# Patient Record
Sex: Female | Born: 1990 | Race: Asian | Hispanic: No | Marital: Married | Smoking: Never smoker
Health system: Southern US, Community
[De-identification: ages and names within clinical notes are randomized; demographics above are authoritative.]

## PROBLEM LIST (undated history)

## (undated) DIAGNOSIS — O24419 Gestational diabetes mellitus in pregnancy, unspecified control: Secondary | ICD-10-CM

## (undated) HISTORY — PX: WISDOM TOOTH EXTRACTION: SHX21

## (undated) HISTORY — DX: Gestational diabetes mellitus in pregnancy, unspecified control: O24.419

---

## 2020-03-29 NOTE — L&D Delivery Note (Signed)
Delivery Note At 12:44 PM a viable female was delivered via Vaginal, Spontaneous.  Presentation: vertex; Position: Left,, Occiput,, Anterior  Delivery of the head: 09/12/2020 12:44 PM First maneuver: 09/12/2020 12:44 PM, McRoberts Second maneuver: 09/12/2020 12:44 PM, Suprapubic Pressure Suspect total delay in delivery of anterior shoulder  less than 30 seconds.  APGAR: 8, 9; weight pend.   Placenta status: labor and delivery, .   Cord:  with the following complications: .  Cord pH: pend  Anesthesia:   Episiotomy: None Lacerations:  2nd degree perineal Suture Repair: 2.0 vicryl Est. Blood Loss (mL):  300 cc  Mom to postpartum.  Baby to Couplet care / Skin to Skin.  Lyn Henri 09/12/2020, 1:14 PM

## 2020-05-23 ENCOUNTER — Ambulatory Visit (INDEPENDENT_AMBULATORY_CARE_PROVIDER_SITE_OTHER): Payer: Managed Care, Other (non HMO)

## 2020-05-23 ENCOUNTER — Other Ambulatory Visit: Payer: Self-pay

## 2020-05-23 DIAGNOSIS — Z3200 Encounter for pregnancy test, result unknown: Secondary | ICD-10-CM | POA: Diagnosis not present

## 2020-05-23 DIAGNOSIS — Z3480 Encounter for supervision of other normal pregnancy, unspecified trimester: Secondary | ICD-10-CM

## 2020-05-23 LAB — POCT URINE PREGNANCY: Preg Test, Ur: POSITIVE — AB

## 2020-05-23 MED ORDER — BLOOD PRESSURE KIT DEVI: 1.0000 | 0 refills | Status: DC | PRN

## 2020-05-23 MED ORDER — VITAFOL ULTRA 29-0.6-0.4-200 MG PO CAPS: 1.0000 | ORAL_CAPSULE | Freq: Every day | ORAL | 12 refills | Status: AC

## 2020-05-23 NOTE — Progress Notes (Signed)
Chart reviewed for nurse visit. Agree with plan of care.   Debar Plate M, MD 05/23/20 11:59 AM 

## 2020-05-23 NOTE — Progress Notes (Signed)
..   Ms. Graber presents today for UPT. She has no unusual complaints. LMP:12-15-20    OBJECTIVE: Appears well, in no apparent distress.  OB History    Gravida  1   Para      Term      Preterm      AB      Living        SAB      IAB      Ectopic      Multiple      Live Births             Home UPT Result:Positive  In-Office UPT result:Positive I have reviewed the patient's medical, obstetrical, social, and family histories, and medications.   ASSESSMENT: Positive pregnancy test  PLAN Prenatal care to be completed at: [facility]

## 2020-05-29 LAB — OB RESULTS CONSOLE HIV ANTIBODY (ROUTINE TESTING): HIV: NONREACTIVE

## 2020-05-29 LAB — OB RESULTS CONSOLE HEPATITIS B SURFACE ANTIGEN: Hepatitis B Surface Ag: NEGATIVE

## 2020-05-29 LAB — OB RESULTS CONSOLE RUBELLA ANTIBODY, IGM: Rubella: NON-IMMUNE/NOT IMMUNE

## 2020-05-29 LAB — HEPATITIS C ANTIBODY: HCV Ab: NEGATIVE

## 2020-06-03 ENCOUNTER — Ambulatory Visit: Payer: Managed Care, Other (non HMO)

## 2020-06-03 ENCOUNTER — Other Ambulatory Visit: Payer: Managed Care, Other (non HMO)

## 2020-06-09 ENCOUNTER — Telehealth: Payer: Self-pay | Admitting: Hematology

## 2020-06-09 NOTE — Telephone Encounter (Signed)
Received a new hem referral from Dr. Langston Masker for abnormal blood protein. Virginia Bush has been cld and scheduled to see Dr. Candise Che on 3/24 at 1pm. Pt aware to arrive 20 minutes early.

## 2020-06-16 DIAGNOSIS — Z3490 Encounter for supervision of normal pregnancy, unspecified, unspecified trimester: Secondary | ICD-10-CM | POA: Insufficient documentation

## 2020-06-17 ENCOUNTER — Other Ambulatory Visit: Payer: Managed Care, Other (non HMO)

## 2020-06-17 ENCOUNTER — Encounter: Payer: Managed Care, Other (non HMO) | Admitting: Medical

## 2020-06-17 DIAGNOSIS — Z3490 Encounter for supervision of normal pregnancy, unspecified, unspecified trimester: Secondary | ICD-10-CM

## 2020-06-18 NOTE — Progress Notes (Signed)
HEMATOLOGY/ONCOLOGY CONSULTATION NOTE  Date of Service: 06/18/2020  No care team member to display  CHIEF COMPLAINTS/PURPOSE OF CONSULTATION:  Abnormal hgb electrophoresis  HISTORY OF PRESENTING ILLNESS:   Virginia Bush is a wonderful 30 y.o. female who has been referred to [place] by Linda Hedges, DO for evaluation and management of abnormal plasma protein. The pt reports that she is doing well overall.  The pt reports that she was referred here by her OBGYN due to abnormal Hgb electrophoresis. The pt reports this is her first pregnancy and she is currently [redacted] weeks pregnant. She has been doing well overall. The pt notes her appetite has changed a lot. She notes no concerns outside of gestational diabetes. The pt notes that her husband is [place] American, but he is unsure of his familial sickle cell trait. The pt notes her husband has no symptoms related to this sickle cell anemia.   The pt notes no medical issues or history of medical problems. The pt notes no surgeries in the past. The pt got dizziness related to Penicillin, but denies any other allergies. The pt notes no medicines currently outside of prenatal vitamins. The pt notes no familial history of blood disorders or medical issues. The pt notes her family has some history with diabetes and heart disease. She notes one past family member had a cancer, but is unsure of the type. The pt denies any smoking or alcohol intake. 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  On review of systems, pt denies abdominal pain, nausea, decreased appetite, leg swelling, and any other symptoms.  MEDICAL HISTORY:  No past medical history on file.  SURGICAL HISTORY: No past surgical history on file.  SOCIAL HISTORY: Social History   Socioeconomic History  . Marital status: Single    Spouse name: Not on file  . Number of children: Not on file  . Years of education: Not on file  . Highest education  level: Not on file  Occupational History  . Not on file  Tobacco Use  . Smoking status: Not on file  . Smokeless tobacco: Not on file  Substance and Sexual Activity  . Alcohol use: Not on file  . Drug use: Not on file  . Sexual activity: Not on file  Other Topics Concern  . Not on file  Social History Narrative  . Not on file   Social Determinants of Health   Financial Resource Strain: Not on file  Food Insecurity: Not on file  Transportation Needs: Not on file  Physical Activity: Not on file  Stress: Not on file  Social Connections: Not on file  Intimate Partner Violence: Not on file    FAMILY HISTORY: No family history on file.  ALLERGIES:  is allergic to penicillins.  MEDICATIONS:  Current Outpatient Medications  Medication Sig Dispense Refill  . Blood Pressure Monitoring (BLOOD PRESSURE KIT) DEVI 1 kit by Does not apply route as needed. 1 each 0  . Prenat-Fe Poly-Methfol-FA-DHA (VITAFOL ULTRA) 29-0.6-0.4-200 MG CAPS Take 1 tablet by mouth daily. 30 capsule 12   No current facility-administered medications for this visit.    REVIEW OF SYSTEMS:   10 Point review of Systems was done is negative except as noted above.  PHYSICAL EXAMINATION: ECOG PERFORMANCE STATUS: 0 - Asymptomatic  . Vitals:   06/19/20 1320  BP: 115/85  Pulse: 78  Resp: 18  Temp: 98.5 F (36.9 C)  SpO2: 98%   Filed Weights  06/19/20 1320  Weight: 172 lb 14.4 oz (78.4 kg)   .Body mass index is 27.08 kg/m.  GENERAL:alert, in no acute distress and comfortable SKIN: no acute rashes, no significant lesions EYES: conjunctiva are pink and non-injected, sclera anicteric OROPHARYNX: MMM, no exudates, no oropharyngeal erythema or ulceration NECK: supple, no JVD LYMPH:  no palpable lymphadenopathy in the cervical, axillary or inguinal regions LUNGS: clear to auscultation b/l with normal respiratory effort HEART: regular rate & rhythm ABDOMEN:  normoactive bowel sounds , non tender, not  distended. Extremity: no pedal edema PSYCH: alert & oriented x 3 with fluent speech NEURO: no focal motor/sensory deficits  LABORATORY DATA:  I have reviewed the data as listed   RADIOGRAPHIC STUDIES: I have personally reviewed the radiological images as listed and agreed with the findings in the report. No results found.  ASSESSMENT & PLAN:   30 yo with   1) Elevated Hb F levels -- borderline elevated at 2.6% Likely related to pregnancy. If still elevated 6 months post partum could have some hereditary persistent of fetal hemoglobin. PLAN: -Advised pt that she had some increase in Hgb F. This fetal Hgb abnormal level could be from her pregnant state. -Advised pt that if she does have minorelevated even >6 months post partum there could be some mild hereditary persistence of fetal Hgb, there is no clinical relevance to this. It will just be a finding that is noted. -Advised pt that fetal Hgb can be elevated while pregnant as well. -Advised pt she does not have sickle cell trait and has a sickle cell Hgb of 0.0. -Continue to eat iron rich foods. -Continue prenatal vitamins. -Will hold off on labs today. Will would recommend she have hemoglobin electrophoresis repeated with OBGY or PCP atleast 6 months postpartum. -Will see back as needed.   FOLLOW UP: RTC with Dr Irene Limbo as needed   All of the patients questions were answered with apparent satisfaction. The patient knows to call the clinic with any problems, questions or concerns.  I spent 35 minutes counseling the patient face to face. The total time spent in the appointment was 45 minutes and more than 50% was on counseling and direct patient cares.    Sullivan Lone MD [place] AAHIVMS [facility] Hematology/Oncology Physician [facility]  (Office):       [phone] (Work cell):  [phone] (Fax):           [phone]  06/18/2020 9:08 PM   I, Reinaldo Raddle, am acting as scribe for Dr. Sullivan Lone, MD.  .I have  reviewed the above documentation for accuracy and completeness, and I agree with the above. Brunetta Genera MD

## 2020-06-19 ENCOUNTER — Telehealth: Payer: Self-pay | Admitting: Hematology

## 2020-06-19 ENCOUNTER — Inpatient Hospital Stay: Payer: Managed Care, Other (non HMO) | Attending: Hematology | Admitting: Hematology

## 2020-06-19 ENCOUNTER — Other Ambulatory Visit: Payer: Self-pay

## 2020-06-19 VITALS — BP 115/85 | HR 78 | Temp 98.5°F | Resp 18 | Ht 67.0 in | Wt 172.9 lb

## 2020-06-19 DIAGNOSIS — Z833 Family history of diabetes mellitus: Secondary | ICD-10-CM | POA: Insufficient documentation

## 2020-06-19 DIAGNOSIS — O26892 Other specified pregnancy related conditions, second trimester: Secondary | ICD-10-CM | POA: Insufficient documentation

## 2020-06-19 DIAGNOSIS — Z3A27 27 weeks gestation of pregnancy: Secondary | ICD-10-CM | POA: Diagnosis not present

## 2020-06-19 DIAGNOSIS — D564 Hereditary persistence of fetal hemoglobin [HPFH]: Secondary | ICD-10-CM

## 2020-06-19 DIAGNOSIS — R778 Other specified abnormalities of plasma proteins: Secondary | ICD-10-CM | POA: Diagnosis present

## 2020-06-19 DIAGNOSIS — Z8249 Family history of ischemic heart disease and other diseases of the circulatory system: Secondary | ICD-10-CM | POA: Diagnosis not present

## 2020-06-19 NOTE — Telephone Encounter (Signed)
Checked out appointment. No LOS notes needing to be scheduled. No changes made. 

## 2020-06-25 ENCOUNTER — Encounter: Payer: Managed Care, Other (non HMO) | Attending: Obstetrics & Gynecology | Admitting: Registered"

## 2020-06-25 ENCOUNTER — Encounter: Payer: Self-pay | Admitting: Registered"

## 2020-06-25 ENCOUNTER — Other Ambulatory Visit: Payer: Self-pay

## 2020-06-25 DIAGNOSIS — O24419 Gestational diabetes mellitus in pregnancy, unspecified control: Secondary | ICD-10-CM | POA: Insufficient documentation

## 2020-06-25 NOTE — Progress Notes (Signed)
Patient was seen on 06/25/20 for Gestational Diabetes self-management class at [facility]. The following learning objectives were met by the patient during this course:   States the definition of Gestational Diabetes  States why dietary management is important in controlling blood glucose  Describes the effects each nutrient has on blood glucose levels  Demonstrates ability to create a balanced meal plan  Demonstrates carbohydrate counting   States when to check blood glucose levels  Demonstrates proper blood glucose monitoring techniques  States the effect of stress and exercise on blood glucose levels  States the importance of limiting caffeine and abstaining from alcohol and smoking  Blood glucose monitor given: Accu-chek Guide Me Lot #[id number] Exp: 05/18/2021 CBG: 74 mg/dL  Patient instructed to monitor glucose levels: FBS: 60 - <95; 1 hour: <140; 2 hour: <120  Patient received handouts:  Nutrition Diabetes and Pregnancy, including carb counting list  Patient will be seen for follow-up as needed.

## 2020-08-13 LAB — OB RESULTS CONSOLE GBS: GBS: POSITIVE

## 2020-09-05 ENCOUNTER — Telehealth (HOSPITAL_COMMUNITY): Payer: Self-pay | Admitting: *Deleted

## 2020-09-05 ENCOUNTER — Encounter (HOSPITAL_COMMUNITY): Payer: Self-pay | Admitting: *Deleted

## 2020-09-05 NOTE — Telephone Encounter (Signed)
Preadmission screen  

## 2020-09-10 ENCOUNTER — Other Ambulatory Visit (HOSPITAL_COMMUNITY)
Admission: RE | Admit: 2020-09-10 | Discharge: 2020-09-10 | Disposition: A | Discharge status: Home / Self Care | Payer: Managed Care, Other (non HMO) | Source: Ambulatory Visit | Attending: Obstetrics and Gynecology | Admitting: Obstetrics and Gynecology

## 2020-09-10 DIAGNOSIS — Z20822 Contact with and (suspected) exposure to covid-19: Secondary | ICD-10-CM | POA: Insufficient documentation

## 2020-09-10 DIAGNOSIS — Z01812 Encounter for preprocedural laboratory examination: Secondary | ICD-10-CM | POA: Insufficient documentation

## 2020-09-10 LAB — SARS CORONAVIRUS 2 (TAT 6-24 HRS): SARS Coronavirus 2: NEGATIVE

## 2020-09-10 NOTE — H&P (Addendum)
OB History and Physical Preadmission H&P for scheduled 2 stage IOL  Virginia Bush is a 30 y.o. female G3P0020 presenting for induction of labor at [redacted]w[redacted]d.  Pregnancy history is notable for GBS positive status (with PCN allergy and clindamycin resistance), A1GDM well controlled.  Most recent US at 65 with EFW 72%ile.  She also underwent US at [employer] [facility] office and was told questionable 6 toes seen on [place].  This finding was not consistent with our office scans.   OB History     Gravida  3   Para      Term      Preterm      AB  2   Living  0      SAB      IAB  2   Ectopic      Multiple      Live Births             Past Medical History:  Diagnosis Date   Gestational diabetes    No past surgical history on file. Family History: family history includes Bone cancer in her paternal grandfather; Breast cancer in her maternal grandmother; Cancer in her paternal grandfather and paternal grandmother; Diabetes in her maternal grandfather; Heart disease in her father; Hypertension in her maternal grandfather and maternal grandmother; Lung cancer in her paternal grandfather. Social History:  reports that she has never smoked. She has never used smokeless tobacco. No history on file for alcohol use and drug use.     Maternal Diabetes: Yes:  Diabetes Type:  Diet controlled Genetic Screening: Normal Maternal Ultrasounds/Referrals: Normal Fetal Ultrasounds or other Referrals:  None Maternal Substance Abuse:  No Significant Maternal Medications:  None Significant Maternal Lab Results:  Group B Strep positive Other Comments:  None  Review of Systems denies fever, chills, SOB, CP, HA, lightheadedness, dizziness. + FM.  History   Last menstrual period 12/16/2019. Exam Physical Exam  Gen: alert, well appearing, no distress Chest: nonlabored breathing CV: no peripheral edema Abdomen: contractions present, gravid, nontender Ext: no evidence of DVT  Prenatal labs: ABO, Rh:    Antibody:   Rubella:   RPR:    HBsAg:    HIV:    GBS:     Assessment/Plan: Admit to Labor and Delivery GBS positive, patient with PCN allergy.  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  Cytotec for cervical ripening, followed by pitocin, AROM Epidural when desired Anticipate vaginal delivery.    Lyn Henri 09/10/2020, 3:08 PM

## 2020-09-12 ENCOUNTER — Encounter (HOSPITAL_COMMUNITY): Payer: Self-pay | Admitting: Obstetrics and Gynecology

## 2020-09-12 ENCOUNTER — Inpatient Hospital Stay (HOSPITAL_COMMUNITY): Payer: Managed Care, Other (non HMO) | Admitting: Anesthesiology

## 2020-09-12 ENCOUNTER — Inpatient Hospital Stay (HOSPITAL_COMMUNITY): Payer: Managed Care, Other (non HMO)

## 2020-09-12 ENCOUNTER — Inpatient Hospital Stay (HOSPITAL_COMMUNITY)
Admission: AD | Admit: 2020-09-12 | Discharge: 2020-09-14 | DRG: 807 | Disposition: A | Discharge status: Home / Self Care | Payer: Managed Care, Other (non HMO) | Attending: Obstetrics and Gynecology | Admitting: Obstetrics and Gynecology

## 2020-09-12 ENCOUNTER — Other Ambulatory Visit: Payer: Self-pay

## 2020-09-12 DIAGNOSIS — Z20822 Contact with and (suspected) exposure to covid-19: Secondary | ICD-10-CM | POA: Diagnosis present

## 2020-09-12 DIAGNOSIS — O99824 Streptococcus B carrier state complicating childbirth: Secondary | ICD-10-CM | POA: Diagnosis present

## 2020-09-12 DIAGNOSIS — Z3A40 40 weeks gestation of pregnancy: Secondary | ICD-10-CM

## 2020-09-12 DIAGNOSIS — Z880 Allergy status to penicillin: Secondary | ICD-10-CM

## 2020-09-12 DIAGNOSIS — O24420 Gestational diabetes mellitus in childbirth, diet controlled: Principal | ICD-10-CM | POA: Diagnosis present

## 2020-09-12 DIAGNOSIS — Z3400 Encounter for supervision of normal first pregnancy, unspecified trimester: Secondary | ICD-10-CM

## 2020-09-12 DIAGNOSIS — Z3490 Encounter for supervision of normal pregnancy, unspecified, unspecified trimester: Secondary | ICD-10-CM

## 2020-09-12 DIAGNOSIS — O26893 Other specified pregnancy related conditions, third trimester: Secondary | ICD-10-CM | POA: Diagnosis present

## 2020-09-12 LAB — CBC
HCT: 36.3 % (ref 36.0–46.0)
Hemoglobin: 12.2 g/dL (ref 12.0–15.0)
MCH: 29.8 pg (ref 26.0–34.0)
MCHC: 33.6 g/dL (ref 30.0–36.0)
MCV: 88.8 fL (ref 80.0–100.0)
Platelets: 223 10*3/uL (ref 150–400)
RBC: 4.09 MIL/uL (ref 3.87–5.11)
RDW: 13.7 % (ref 11.5–15.5)
WBC: 9.4 10*3/uL (ref 4.0–10.5)
nRBC: 0 % (ref 0.0–0.2)

## 2020-09-12 LAB — TYPE AND SCREEN
ABO/RH(D): B POS
Antibody Screen: NEGATIVE

## 2020-09-12 LAB — GLUCOSE, CAPILLARY
Glucose-Capillary: 86 mg/dL (ref 70–99)
Glucose-Capillary: 90 mg/dL (ref 70–99)
Glucose-Capillary: 132 mg/dL — ABNORMAL HIGH (ref 70–99)

## 2020-09-12 LAB — RPR: RPR Ser Ql: NONREACTIVE

## 2020-09-12 MED ORDER — DIPHENHYDRAMINE HCL 50 MG/ML IJ SOLN: 12.5000 mg | INTRAMUSCULAR | Status: DC | PRN

## 2020-09-12 MED ORDER — LIDOCAINE HCL (PF) 1 % IJ SOLN: 30.0000 mL | INTRAMUSCULAR | Status: DC | PRN

## 2020-09-12 MED ORDER — FENTANYL-BUPIVACAINE-NACL 0.5-0.125-0.9 MG/250ML-% EP SOLN
EPIDURAL | Status: DC | PRN
  Administered 2020-09-12: 12 mL/h via EPIDURAL

## 2020-09-12 MED ORDER — VANCOMYCIN HCL IN DEXTROSE 1-5 GM/200ML-% IV SOLN
1000.0000 mg | Freq: Two times a day (BID) | INTRAVENOUS | Status: DC
  Administered 2020-09-12: 1000 mg via INTRAVENOUS
  Filled 2020-09-12: qty 200

## 2020-09-12 MED ORDER — ZOLPIDEM TARTRATE 5 MG PO TABS: 5.0000 mg | ORAL_TABLET | Freq: Every evening | ORAL | Status: DC | PRN

## 2020-09-12 MED ORDER — OXYCODONE-ACETAMINOPHEN 5-325 MG PO TABS
2.0000 | ORAL_TABLET | ORAL | Status: DC | PRN
Start: 2020-09-12 — End: 2020-09-12

## 2020-09-12 MED ORDER — PHENYLEPHRINE 40 MCG/ML (10ML) SYRINGE FOR IV PUSH (FOR BLOOD PRESSURE SUPPORT): 80.0000 ug | PREFILLED_SYRINGE | INTRAVENOUS | Status: DC | PRN

## 2020-09-12 MED ORDER — IBUPROFEN 600 MG PO TABS
600.0000 mg | ORAL_TABLET | Freq: Four times a day (QID) | ORAL | Status: DC
  Administered 2020-09-12 – 2020-09-14 (×8): 600 mg via ORAL
  Filled 2020-09-12 (×8): qty 1

## 2020-09-12 MED ORDER — SOD CITRATE-CITRIC ACID 500-334 MG/5ML PO SOLN: 30.0000 mL | ORAL | Status: DC | PRN

## 2020-09-12 MED ORDER — FENTANYL-BUPIVACAINE-NACL 0.5-0.125-0.9 MG/250ML-% EP SOLN
12.0000 mL/h | EPIDURAL | Status: DC | PRN
  Filled 2020-09-12: qty 250

## 2020-09-12 MED ORDER — HYDROXYZINE HCL 50 MG PO TABS: 50.0000 mg | ORAL_TABLET | Freq: Four times a day (QID) | ORAL | Status: DC | PRN

## 2020-09-12 MED ORDER — ONDANSETRON HCL 4 MG/2ML IJ SOLN: 4.0000 mg | Freq: Four times a day (QID) | INTRAMUSCULAR | Status: DC | PRN

## 2020-09-12 MED ORDER — PHENYLEPHRINE 40 MCG/ML (10ML) SYRINGE FOR IV PUSH (FOR BLOOD PRESSURE SUPPORT)
80.0000 ug | PREFILLED_SYRINGE | INTRAVENOUS | Status: DC | PRN
  Filled 2020-09-12: qty 10

## 2020-09-12 MED ORDER — ACETAMINOPHEN 325 MG PO TABS: 650.0000 mg | ORAL_TABLET | ORAL | Status: DC | PRN

## 2020-09-12 MED ORDER — SENNOSIDES-DOCUSATE SODIUM 8.6-50 MG PO TABS
2.0000 | ORAL_TABLET | ORAL | Status: DC
  Administered 2020-09-12 – 2020-09-13 (×2): 2 via ORAL
  Filled 2020-09-12 (×2): qty 2

## 2020-09-12 MED ORDER — OXYTOCIN-SODIUM CHLORIDE 30-0.9 UT/500ML-% IV SOLN
1.0000 m[IU]/min | INTRAVENOUS | Status: DC
Start: 2020-09-12 — End: 2020-09-12
  Administered 2020-09-12: 2 m[IU]/min via INTRAVENOUS

## 2020-09-12 MED ORDER — ONDANSETRON HCL 4 MG PO TABS: 4.0000 mg | ORAL_TABLET | ORAL | Status: DC | PRN

## 2020-09-12 MED ORDER — TERBUTALINE SULFATE 1 MG/ML IJ SOLN: 0.2500 mg | Freq: Once | INTRAMUSCULAR | Status: DC | PRN

## 2020-09-12 MED ORDER — SIMETHICONE 80 MG PO CHEW
80.0000 mg | CHEWABLE_TABLET | ORAL | Status: DC | PRN
Start: 2020-09-12 — End: 2020-09-14

## 2020-09-12 MED ORDER — LIDOCAINE HCL (PF) 1 % IJ SOLN
INTRAMUSCULAR | Status: DC | PRN
  Administered 2020-09-12: 5 mL via EPIDURAL

## 2020-09-12 MED ORDER — MISOPROSTOL 25 MCG QUARTER TABLET
25.0000 ug | ORAL_TABLET | ORAL | Status: DC | PRN
  Administered 2020-09-12: 25 ug via VAGINAL
  Filled 2020-09-12 (×2): qty 1

## 2020-09-12 MED ORDER — WITCH HAZEL-GLYCERIN EX PADS: 1.0000 "application " | MEDICATED_PAD | CUTANEOUS | Status: DC | PRN

## 2020-09-12 MED ORDER — BENZOCAINE-MENTHOL 20-0.5 % EX AERO
1.0000 "application " | INHALATION_SPRAY | CUTANEOUS | Status: DC | PRN
  Administered 2020-09-12: 1 via TOPICAL
  Filled 2020-09-12: qty 56

## 2020-09-12 MED ORDER — EPHEDRINE 5 MG/ML INJ: 10.0000 mg | INTRAVENOUS | Status: DC | PRN

## 2020-09-12 MED ORDER — LACTATED RINGERS IV SOLN: INTRAVENOUS | Status: DC

## 2020-09-12 MED ORDER — DIBUCAINE (PERIANAL) 1 % EX OINT
1.0000 "application " | TOPICAL_OINTMENT | CUTANEOUS | Status: DC | PRN
  Administered 2020-09-12: 1 via RECTAL
  Filled 2020-09-12: qty 28

## 2020-09-12 MED ORDER — OXYTOCIN BOLUS FROM INFUSION
333.0000 mL | Freq: Once | INTRAVENOUS | Status: AC
  Administered 2020-09-12: 333 mL via INTRAVENOUS

## 2020-09-12 MED ORDER — LACTATED RINGERS IV SOLN
500.0000 mL | Freq: Once | INTRAVENOUS | Status: AC
  Administered 2020-09-12: 500 mL via INTRAVENOUS

## 2020-09-12 MED ORDER — LACTATED RINGERS IV SOLN
500.0000 mL | INTRAVENOUS | Status: DC | PRN
  Administered 2020-09-12: 500 mL via INTRAVENOUS

## 2020-09-12 MED ORDER — COCONUT OIL OIL
1.0000 "application " | TOPICAL_OIL | Status: DC | PRN
  Administered 2020-09-14: 1 via TOPICAL

## 2020-09-12 MED ORDER — BUTORPHANOL TARTRATE 1 MG/ML IJ SOLN
1.0000 mg | INTRAMUSCULAR | Status: DC | PRN
  Administered 2020-09-12 (×2): 1 mg via INTRAVENOUS
  Filled 2020-09-12 (×2): qty 1

## 2020-09-12 MED ORDER — ONDANSETRON HCL 4 MG/2ML IJ SOLN: 4.0000 mg | INTRAMUSCULAR | Status: DC | PRN

## 2020-09-12 MED ORDER — TETANUS-DIPHTH-ACELL PERTUSSIS 5-2.5-18.5 LF-MCG/0.5 IM SUSY: 0.5000 mL | PREFILLED_SYRINGE | Freq: Once | INTRAMUSCULAR | Status: DC

## 2020-09-12 MED ORDER — PRENATAL MULTIVITAMIN CH
1.0000 | ORAL_TABLET | Freq: Every day | ORAL | Status: DC
  Administered 2020-09-13 – 2020-09-14 (×2): 1 via ORAL
  Filled 2020-09-12 (×2): qty 1

## 2020-09-12 MED ORDER — OXYCODONE-ACETAMINOPHEN 5-325 MG PO TABS: 1.0000 | ORAL_TABLET | ORAL | Status: DC | PRN

## 2020-09-12 MED ORDER — OXYTOCIN-SODIUM CHLORIDE 30-0.9 UT/500ML-% IV SOLN
2.5000 [IU]/h | INTRAVENOUS | Status: DC
  Filled 2020-09-12: qty 500

## 2020-09-12 MED ORDER — DIPHENHYDRAMINE HCL 25 MG PO CAPS: 25.0000 mg | ORAL_CAPSULE | Freq: Four times a day (QID) | ORAL | Status: DC | PRN

## 2020-09-12 NOTE — Anesthesia Procedure Notes (Signed)
Epidural Patient location during procedure: OB Start time: 09/12/2020 8:23 AM End time: 09/12/2020 8:38 AM  Staffing Anesthesiologist: Lucretia Kern, MD Performed: anesthesiologist   Preanesthetic Checklist Completed: patient identified, IV checked, risks and benefits discussed, monitors and equipment checked, pre-op evaluation and timeout performed  Epidural Patient position: sitting Prep: DuraPrep Patient monitoring: heart rate, continuous pulse ox and blood pressure Approach: midline Location: L3-L4 Injection technique: LOR air  Needle:  Needle type: Tuohy  Needle gauge: 17 G Needle length: 9 cm Needle insertion depth: 5 cm Catheter type: closed end flexible Catheter size: 19 Gauge Catheter at skin depth: 10 cm Test dose: negative  Assessment Events: blood not aspirated, injection not painful, no injection resistance, no paresthesia and negative IV test  Additional Notes Reason for block:procedure for pain

## 2020-09-12 NOTE — Anesthesia Preprocedure Evaluation (Signed)
Anesthesia Evaluation  Patient identified by MRN, date of birth, ID band Patient awake    Reviewed: Allergy & Precautions, H&P , NPO status , Patient's Chart, lab work & pertinent test results  History of Anesthesia Complications Negative for: history of anesthetic complications  Airway Mallampati: II  TM Distance: >3 FB     Dental   Pulmonary neg pulmonary ROS,    Pulmonary exam normal        Cardiovascular negative cardio ROS   Rhythm:regular Rate:Normal     Neuro/Psych negative neurological ROS  negative psych ROS   GI/Hepatic negative GI ROS, Neg liver ROS,   Endo/Other  diabetes, Gestational  Renal/GU negative Renal ROS  negative genitourinary   Musculoskeletal   Abdominal   Peds  Hematology negative hematology ROS (+)   Anesthesia Other Findings   Reproductive/Obstetrics (+) Pregnancy                             Anesthesia Physical Anesthesia Plan  ASA: 2  Anesthesia Plan: Epidural   Post-op Pain Management:    Induction:   PONV Risk Score and Plan:   Airway Management Planned:   Additional Equipment:   Intra-op Plan:   Post-operative Plan:   Informed Consent: I have reviewed the patients History and Physical, chart, labs and discussed the procedure including the risks, benefits and alternatives for the proposed anesthesia with the patient or authorized representative who has indicated his/her understanding and acceptance.       Plan Discussed with:   Anesthesia Plan Comments:         Anesthesia Quick Evaluation  

## 2020-09-12 NOTE — Lactation Note (Signed)
This note was copied from a baby's chart. Lactation Consultation Note  Patient Name: Virginia Bush [id number] Date: 09/12/2020 Reason for consult: L&D Initial assessment Age:31 hours  L&D RN called me to let me know that infant was crawling to the breast. I went to L&D room to assist with latch. I assisted "Zach" w/latching. Mom was comfortable with latch. Parents were pleased.  Dad was shown how to help with infant's positioning and how to create an air space for nose, as Mom's arm movements were limited due to IV, BP cuff, etc.   Mom is a P1 and reports + breast changes w/pregnancy.   Lurline Hare [facility] 09/12/2020, 1:55 PM

## 2020-09-12 NOTE — Progress Notes (Signed)
Labor Progress Note  Patient doing well, responded well to cytotec.  No comfortable with epidural and transitioning to pitocin.  AROM performed in usually fashion, head was well applied. Fluid clear.  Cervix after AROM 6/80/-2.  FHT cat 1, accel noted with AROM.  Nilda Simmer MD

## 2020-09-13 LAB — CBC
HCT: 30.1 % — ABNORMAL LOW (ref 36.0–46.0)
Hemoglobin: 9.9 g/dL — ABNORMAL LOW (ref 12.0–15.0)
MCH: 30.1 pg (ref 26.0–34.0)
MCHC: 32.9 g/dL (ref 30.0–36.0)
MCV: 91.5 fL (ref 80.0–100.0)
Platelets: 178 10*3/uL (ref 150–400)
RBC: 3.29 MIL/uL — ABNORMAL LOW (ref 3.87–5.11)
RDW: 13.9 % (ref 11.5–15.5)
WBC: 11.4 10*3/uL — ABNORMAL HIGH (ref 4.0–10.5)
nRBC: 0 % (ref 0.0–0.2)

## 2020-09-13 MED ORDER — SALINE SPRAY 0.65 % NA SOLN
1.0000 | NASAL | Status: DC | PRN
  Filled 2020-09-13: qty 44

## 2020-09-13 MED ORDER — ACETAMINOPHEN 325 MG PO TABS: 650.0000 mg | ORAL_TABLET | ORAL | 0 refills | Status: AC | PRN

## 2020-09-13 MED ORDER — IRON (FERROUS SULFATE) 325 (65 FE) MG PO TABS: 1.0000 | ORAL_TABLET | ORAL | 0 refills | Status: AC

## 2020-09-13 MED ORDER — IBUPROFEN 600 MG PO TABS: 600.0000 mg | ORAL_TABLET | Freq: Four times a day (QID) | ORAL | 0 refills | Status: AC

## 2020-09-13 MED ORDER — DOCUSATE SODIUM 100 MG PO CAPS: 100.0000 mg | ORAL_CAPSULE | Freq: Two times a day (BID) | ORAL | 0 refills | Status: AC

## 2020-09-13 NOTE — Progress Notes (Signed)
Postpartum Progress Note  Post Partum Day 1 s/p spontaneous vaginal delivery.  Patient reports well-controlled pain, ambulating without difficulty, voiding spontaneously, tolerating PO.  Vaginal bleeding is appropriate. She reports some residual numbness in her R leg.   Objective: Blood pressure 118/72, pulse 66, temperature 98.6 F (37 C), temperature source Oral, resp. rate 18, weight 87.9 kg, last menstrual period 12/16/2019, SpO2 100 %, unknown if currently breastfeeding.  Physical Exam:  General: alert and no distress Lochia: appropriate Uterine Fundus: firm DVT Evaluation: No evidence of DVT seen on physical exam.  Recent Labs    09/12/20 0052 09/13/20 0504  HGB 12.2 9.9*  HCT 36.3 30.1*    Assessment/Plan: Postpartum Day 1, s/p vaginal delivery. Continue routine postpartum care Lactation following Baby boy, will perform circ prior to discharge Anticipate discharge home today or tomorrow   LOS: 1 day   Lyn Henri 09/13/2020, 7:50 AM

## 2020-09-13 NOTE — Anesthesia Postprocedure Evaluation (Signed)
Anesthesia Post Note  Patient: Virginia Bush  Procedure(s) Performed: AN AD HOC LABOR EPIDURAL     Patient location during evaluation: Mother Baby Anesthesia Type: Epidural Level of consciousness: awake and alert Pain management: pain level controlled Vital Signs Assessment: post-procedure vital signs reviewed and stable Respiratory status: spontaneous breathing, nonlabored ventilation and respiratory function stable Cardiovascular status: stable Postop Assessment: no headache, no backache and epidural receding Anesthetic complications: no   No notable events documented.  Last Vitals:  Vitals:   09/13/20 0005 09/13/20 0545  BP: 120/89 118/72  Pulse: 66 66  Resp: 18 18  Temp: 36.6 C 37 C  SpO2: 100% 100%    Last Pain:  Vitals:   09/13/20 0921  TempSrc:   PainSc: 0-No pain   Pain Goal:                   Rica Records

## 2020-09-13 NOTE — Lactation Note (Addendum)
This note was copied from a baby's chart. Lactation Consultation Note  Patient Name: Virginia Bush Date: 09/13/2020 Reason for consult: Initial assessment Age:31 Hours   Mother is a P46, Mother reports that infant has been sleeping since circumcision. She has attempt to breastfeed once , but infant too sleepy.  Mother was given [facility] brochure and basic teaching done. Parents has lots of questions that were answered.  Reviewed hand expression with mother. Observed large drops of colostrum. Observed that mother has dimpled nipples. The Rt nipple is semi- inverted. Nipple retracts when compressed.  Infant placed in cross cradle hold. Infant latched, assist with flanging lips for wide gape. Parents observed infant with swallowing pattern of suckling and audible swallows. Infant sustained latch for 20 mins. Infant pinched nipple with only to section of nipple protruding. Infant switched to football hold. Infant obtained a much deeper latch. Observed for 15 mins. Mother nipple round when infant released the breast.  Discussed feeding infant with early or mid feeding cues.  Parents receptive to all teaching.  Staff nurse brought in a Harmony hand pump and will advise in use.  Instruct mother to hand express and spoon feed if infant doesn't have a good feeding. She will page staff member to assist if she decides to spoon feed infant.  Discussed cue base feeding. Parents were discussing going home this pm. After discussing more teaching about cluster feeding parents will likely stay until am. Mother to continue to cue base feed infant and feed at least 8-12 times or more in 24 hours and advised to allow for cluster feeding infant as needed.   Mother to continue to due STS. Mother is aware of available LC services at [facility], BFSG'S, OP Dept, and phone # for questions or concerns about breastfeeding.  Mother receptive to all teaching and plan of care.    Maternal Data Has patient been taught Hand  Expression?: Yes Does the patient have breastfeeding experience prior to this delivery?: No  Feeding Mother's Current Feeding Choice: Breast Milk  LATCH Score Latch: Grasps breast easily, tongue down, lips flanged, rhythmical sucking.  Audible Swallowing: Spontaneous and intermittent  Type of Nipple: Everted at rest and after stimulation (nipple round when infant released the breast)  Comfort (Breast/Nipple): Soft / non-tender  Hold (Positioning): Assistance needed to correctly position infant at breast and maintain latch.  LATCH Score: 9   Lactation Tools Discussed/Used    Interventions    Discharge Discharge Education: Engorgement and breast care;Warning signs for feeding baby;Outpatient recommendation (Parents wanted to be D/C this pm. undecided at this time.) Pump: Personal (Spectra at home)  Consult Status Consult Status: Follow-up Date: 09/13/20 Follow-up type: In-patient    Virginia Bush [facility] 09/13/2020, 4:46 PM

## 2020-09-14 NOTE — Lactation Note (Addendum)
This note was copied from a baby's chart. Lactation Consultation Note  Patient Name: Virginia Bush [id number] Date: 09/14/2020 Reason for consult: Follow-up assessment Age:31 Hours   Mother paged for [facility] assistance. Mother had questions about hand expression and latching infant.  Mother reports that her milk has gone away and her nipples are now sore.  Assist  mother with hand expression technique and observed flow of colostrum.  Mothers nipples are slightly pink in the center. Mother was given comfort gels and coconut oil with instructions to use as needed.   Assist with placing infant on the breast in football hold. Infant sustained latch for 10 min.  Mother reports that she has a pain scale of a #4-5. Infant unlatched from the breast. Assessed infants lip and tongue. Observed that infant has upper lip restrictions and observed a tight anterior frenulum.   Encouraged mother to follow up with OP LC. Send a message through Epic for mother to get a call for follow.  Mother was also given a handout with providers names for follow up to evaluate infants tongue mobility.  Discussed pre-pumping before latching infant . Discussed treatment and prevention of engorgement.  Reviewed all LC resources for follow up with Mother . She has very little support in area at this time. Parents receptive to all teaching and call LC services as needed.   Maternal Data    Feeding Mother's Current Feeding Choice: Breast Milk  LATCH Score Latch: Grasps breast easily, tongue down, lips flanged, rhythmical sucking.  Audible Swallowing: A few with stimulation  Type of Nipple: Everted at rest and after stimulation  Comfort (Breast/Nipple): Filling, red/small blisters or bruises, mild/mod discomfort  Hold (Positioning): Assistance needed to correctly position infant at breast and maintain latch.  LATCH Score: 7   Lactation Tools Discussed/Used    Interventions Interventions: Assisted with  latch;Pre-pump if needed;Adjust position;Support pillows;Position options;Breast compression;Comfort gels;Education  Discharge Discharge Education: Engorgement and breast care;Warning signs for feeding baby;Outpatient recommendation;Outpatient Epic message sent Pump: Manual;Personal  Consult Status Consult Status: Complete    Michel Bickers 09/14/2020, 12:32 PM

## 2020-09-14 NOTE — Discharge Summary (Signed)
Obstetric Discharge Summary  Virginia Bush is a 30 y.o. female that presented on 09/12/2020 for IOL at 40w. Pregnancy course notable for A1GDM, well controlled.  She was admitted to labor and delivery for her induction.  Her labor course was uncomplicated and she delivered a viable female infant on 09/12/20.  Her postpartum course was uncomplicated and on PPD#2, she reported well controlled pain, spontaneous voiding, ambulating without difficulty, and tolerating PO.  She was stable for discharge home on 09/14/20 with plans for in-office follow up.  Hemoglobin  Date Value Ref Range Status  09/13/2020 9.9 (L) 12.0 - 15.0 g/dL Final   HCT  Date Value Ref Range Status  09/13/2020 30.1 (L) 36.0 - 46.0 % Final    Physical Exam:  General: alert and no distress Lochia: appropriate Uterine Fundus: firm DVT Evaluation: No evidence of DVT seen on physical exam.  Discharge Diagnoses: Term Pregnancy-delivered  Discharge Information: Date: 09/14/2020 Activity: Pelvic rest, as tolerated Diet: routine Medications: Tylenol, motrin, iron and colace. Condition: stable Instructions: Refer to practice specific booklet.  Discussed prior to discharge.  Discharge to: Home  Follow-up Information     [place] Follow up.   Why: Please follow up for 6 week postpartum visit and glucose tolerance test. Contact information: [address] [place] [address] [phone]                 Newborn Data: Live born female  Birth Weight: 8 lb 11.7 oz (3960 g) APGAR: 8, 9  Newborn Delivery   Time head delivered: 09/12/2020 12:44:00 Birth date/time: 09/12/2020 12:44:00 Delivery type: Vaginal, Spontaneous      Home with mother.  Lyn Henri 09/14/2020, 1:11 PM

## 2020-09-14 NOTE — Progress Notes (Signed)
Postpartum Progress Note  Post Partum Day 1 s/p spontaneous vaginal delivery. No issues overnight, patient decided to stay to work on baby's feeding schedule. Patient reports well-controlled pain, ambulating without difficulty, voiding spontaneously, tolerating PO.  Vaginal bleeding is appropriate.   Objective: Blood pressure 129/82, pulse 67, temperature 98.4 F (36.9 C), temperature source Oral, resp. rate 18, weight 87.9 kg, last menstrual period 12/16/2019, SpO2 100 %, unknown if currently breastfeeding.  Physical Exam:  General: alert and no distress, up, moving throughout room Lochia: appropriate Uterine Fundus: firm DVT Evaluation: No evidence of DVT seen on physical exam.  Recent Labs    09/12/20 0052 09/13/20 0504  HGB 12.2 9.9*  HCT 36.3 30.1*    Assessment/Plan: Postpartum Day 2, s/p vaginal delivery. Continue routine postpartum care Lactation following Baby Zack s/p circumcision Anticipate discharge home today   LOS: 2 days   Lyn Henri 09/14/2020, 7:48 AM

## 2020-09-19 ENCOUNTER — Other Ambulatory Visit: Payer: Self-pay | Admitting: Obstetrics and Gynecology

## 2020-09-19 DIAGNOSIS — R2232 Localized swelling, mass and lump, left upper limb: Secondary | ICD-10-CM

## 2020-09-21 ENCOUNTER — Inpatient Hospital Stay (HOSPITAL_COMMUNITY): Admit: 2020-09-21 | Payer: Self-pay

## 2020-09-23 ENCOUNTER — Other Ambulatory Visit: Payer: Self-pay

## 2020-09-23 ENCOUNTER — Other Ambulatory Visit: Payer: Self-pay | Admitting: Obstetrics and Gynecology

## 2020-09-23 ENCOUNTER — Ambulatory Visit
Admission: RE | Admit: 2020-09-23 | Discharge: 2020-09-23 | Disposition: A | Discharge status: Home / Self Care | Payer: Managed Care, Other (non HMO) | Source: Ambulatory Visit | Attending: Obstetrics and Gynecology | Admitting: Obstetrics and Gynecology

## 2020-09-23 DIAGNOSIS — R2232 Localized swelling, mass and lump, left upper limb: Secondary | ICD-10-CM

## 2020-10-22 ENCOUNTER — Inpatient Hospital Stay: Admission: RE | Admit: 2020-10-22 | Payer: Managed Care, Other (non HMO) | Source: Ambulatory Visit

## 2021-03-25 ENCOUNTER — Other Ambulatory Visit: Payer: Managed Care, Other (non HMO)

## 2022-02-02 IMAGING — MG DIGITAL DIAGNOSTIC BILAT W/ TOMO W/ CAD
6 of 10 series · 6 of 30 positions shown · non-contrast
Comparison: None.

CLINICAL DATA: Recently pregnant/delivered. Now with palpable lump
in the LEFT axilla. This is patient's first mammogram.

EXAM:
DIGITAL DIAGNOSTIC BILATERAL MAMMOGRAM WITH TOMOSYNTHESIS AND CAD;
US AXILLARY LEFT
TECHNIQUE: Bilateral digital diagnostic mammography and breast tomosynthesis
was performed. The images were evaluated with computer-aided
detection.; Targeted ultrasound examination of the left axilla was
performed.

[L CC synth-2D]
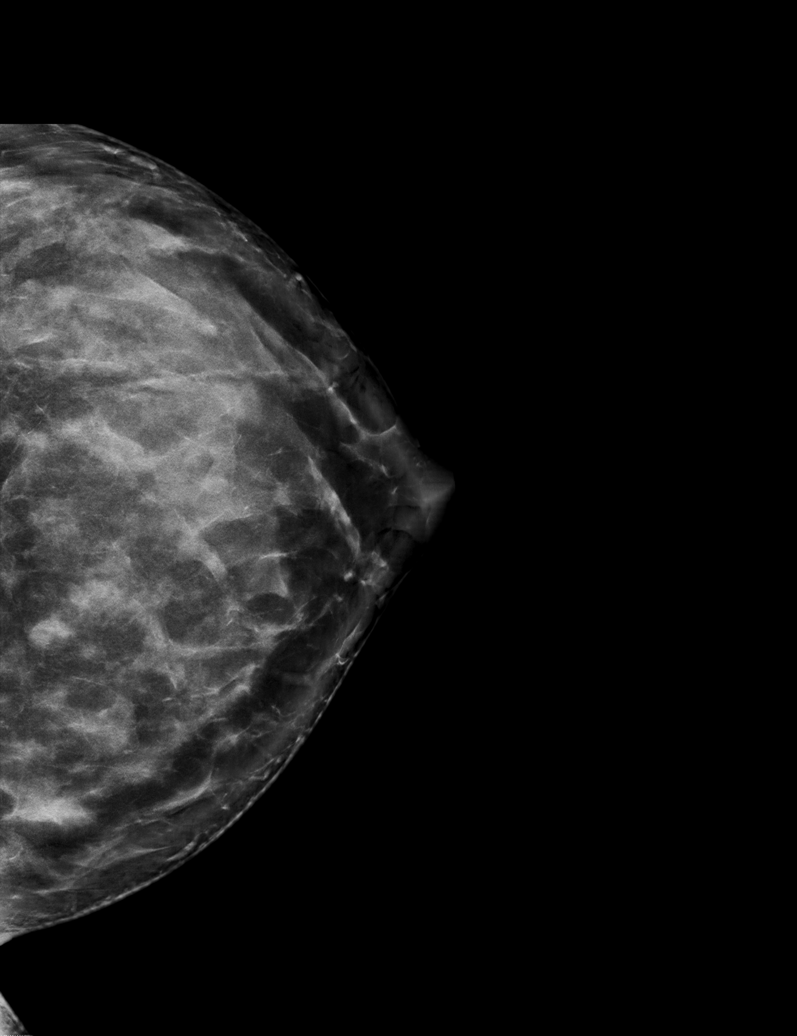

[R CC synth-2D]
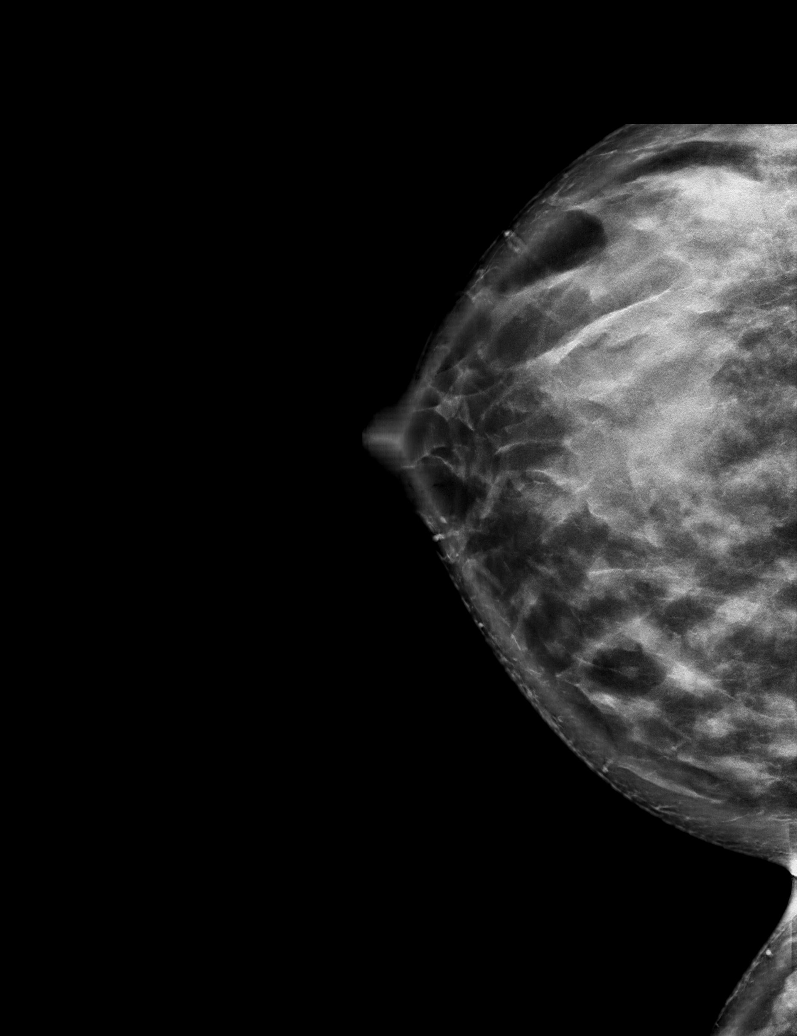

[L MLO synth-2D (1 of 2)]
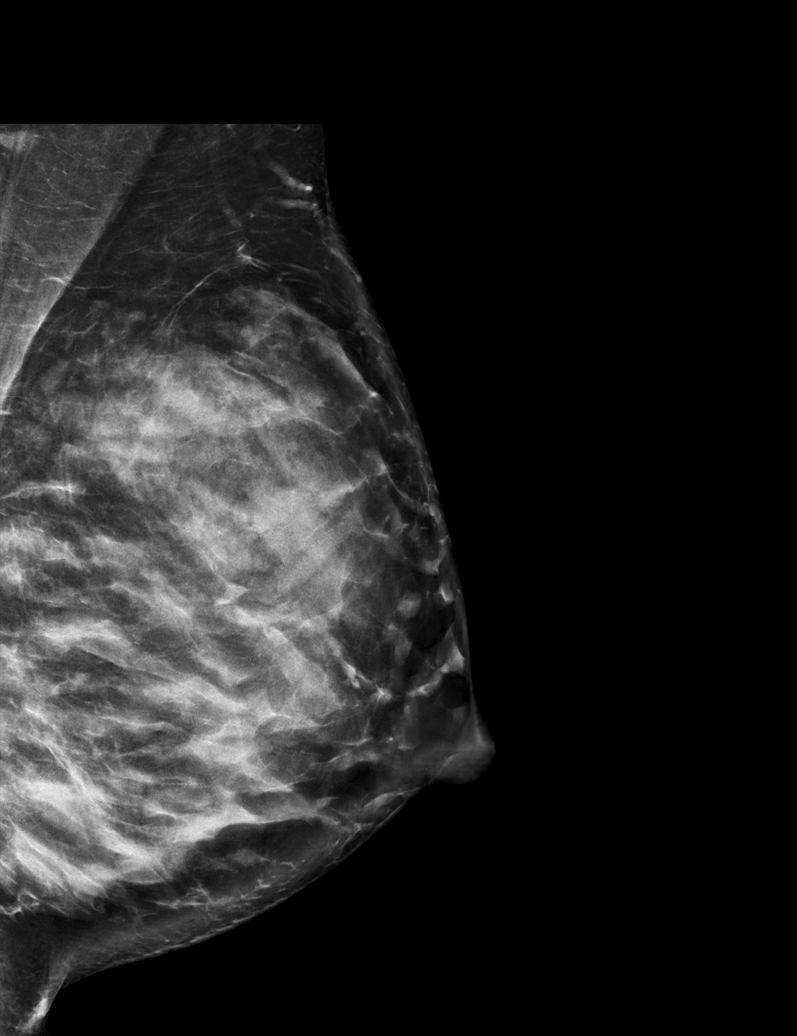

[R MLO synth-2D]
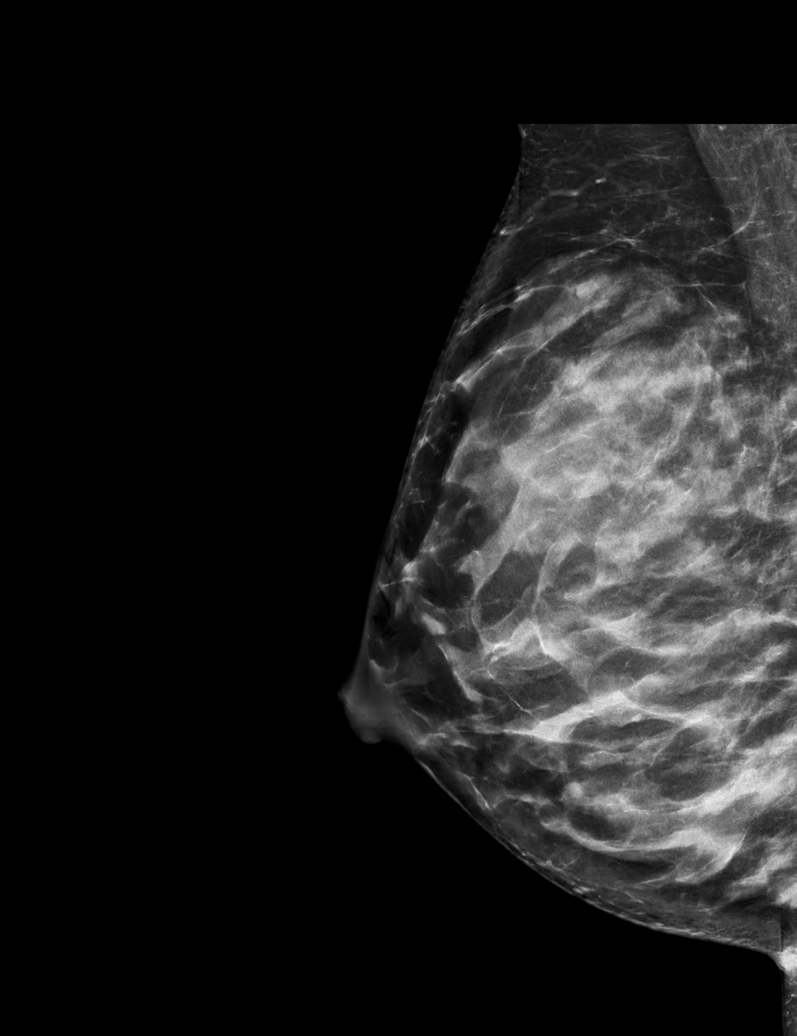

[L MLO synth-2D (2 of 2)]
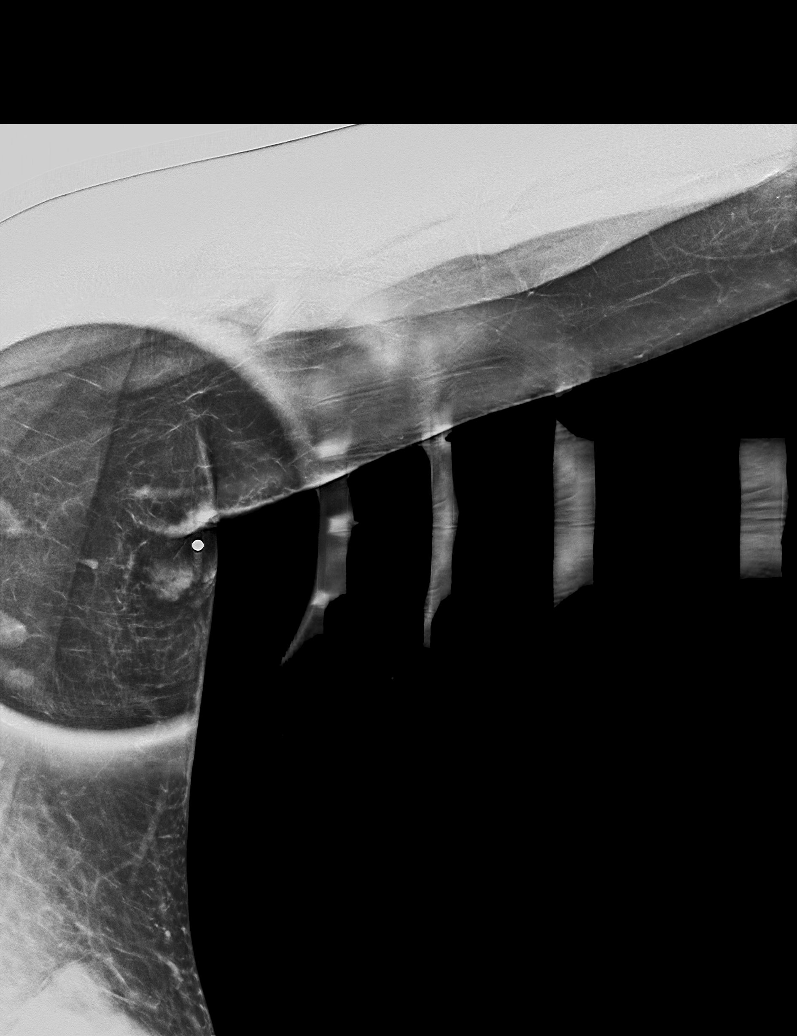

[L CC tomo · tomo slice 48/95.0]
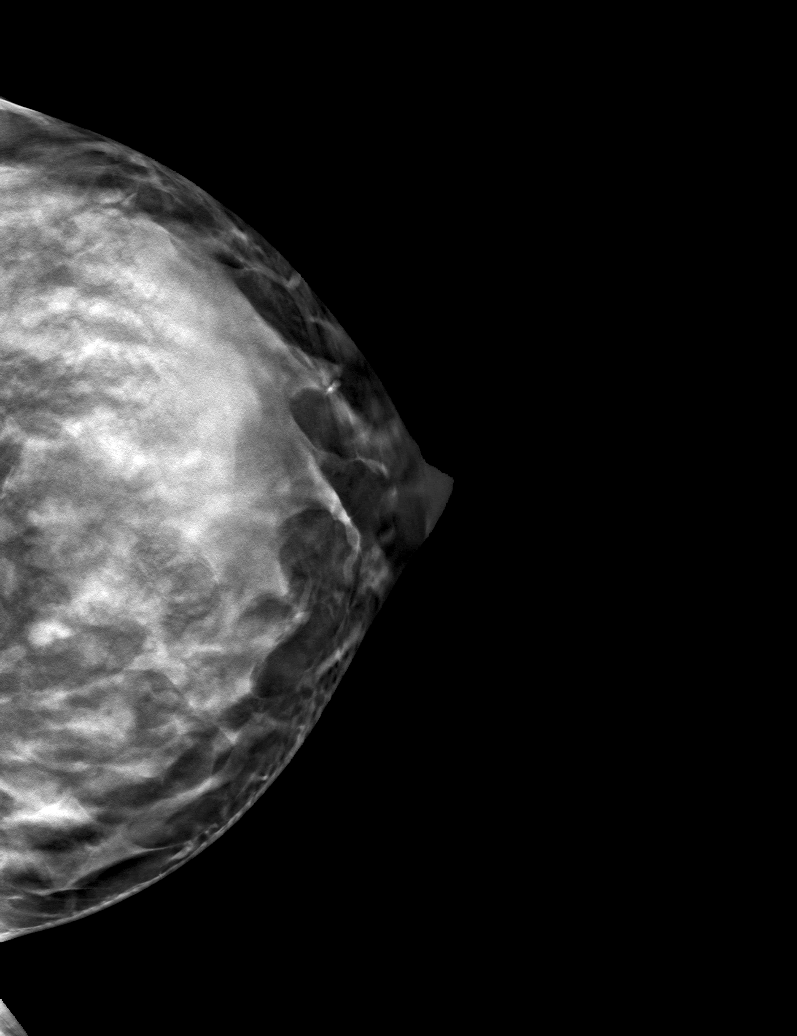

[6 of 30 positions shown; findings below may reference images not displayed]

ACR Breast Density Category d: The breast tissue is extremely dense,
which lowers the sensitivity of mammography.
FINDINGS: Bilateral diagnostic mammogram: There are no dominant masses,
suspicious calcifications or secondary signs of malignancy
identified within either breast.

On physical exam, there is visible palpable lump within the LEFT
axilla. No skin redness. No associated pain, per the patient.

Targeted ultrasound is performed, evaluating the LEFT axilla as
directed by the patient, showing a cystic-appearing mass within the
superficial soft tissues of the LEFT axilla, with serpiginous
configuration, without internal vascularity, overall measuring
cm greatest dimension, the dominant cystic component measuring 7 x 4
x 6 mm, most suggestive of a benign epidermal inclusion cyst or
galactocele.
IMPRESSION: Probably benign complicated inclusion cyst versus galactocele within
the LEFT axilla. Recommend follow-up ultrasound in 1 month to ensure
stability or resolution.

RECOMMENDATION:
1. LEFT axillary ultrasound in 1 month.
2. Patient was instructed to return sooner if any skin redness or
pain developed.

I have discussed the findings and recommendations with the patient.
If applicable, a reminder letter will be sent to the patient
regarding the next appointment.

BI-RADS CATEGORY  3: Probably benign.
